# Patient Record
Sex: Male | Born: 1990 | Race: Black or African American | Hispanic: No | Marital: Single | Smoking: Current every day smoker
Health system: Southern US, Community
[De-identification: ages and names within clinical notes are randomized; demographics above are authoritative.]

## PROBLEM LIST (undated history)

## (undated) DIAGNOSIS — N200 Calculus of kidney: Secondary | ICD-10-CM

## (undated) DIAGNOSIS — J189 Pneumonia, unspecified organism: Secondary | ICD-10-CM

---

## 2015-09-18 ENCOUNTER — Encounter: Payer: Self-pay | Admitting: Emergency Medicine

## 2015-09-18 ENCOUNTER — Emergency Department
Admission: EM | Admit: 2015-09-18 | Discharge: 2015-09-18 | Disposition: A | Discharge status: Home / Self Care | Payer: Self-pay | Attending: Emergency Medicine | Admitting: Emergency Medicine

## 2015-09-18 DIAGNOSIS — A6000 Herpesviral infection of urogenital system, unspecified: Secondary | ICD-10-CM | POA: Insufficient documentation

## 2015-09-18 DIAGNOSIS — F17200 Nicotine dependence, unspecified, uncomplicated: Secondary | ICD-10-CM | POA: Insufficient documentation

## 2015-09-18 DIAGNOSIS — F1290 Cannabis use, unspecified, uncomplicated: Secondary | ICD-10-CM | POA: Insufficient documentation

## 2015-09-18 LAB — URINALYSIS COMPLETE WITH MICROSCOPIC (ARMC ONLY)
BACTERIA UA: NONE SEEN
BILIRUBIN URINE: NEGATIVE
GLUCOSE, UA: NEGATIVE mg/dL
HGB URINE DIPSTICK: NEGATIVE
KETONES UR: NEGATIVE mg/dL
LEUKOCYTES UA: NEGATIVE
NITRITE: NEGATIVE
Protein, ur: NEGATIVE mg/dL
Specific Gravity, Urine: 1.011 (ref 1.005–1.030)
Squamous Epithelial / LPF: NONE SEEN
pH: 7 (ref 5.0–8.0)

## 2015-09-18 MED ORDER — ACYCLOVIR 800 MG PO TABS: 800.0000 mg | ORAL_TABLET | Freq: Every day | ORAL | Status: DC

## 2015-09-18 NOTE — Discharge Instructions (Signed)

## 2015-09-18 NOTE — ED Notes (Signed)
Pt presents to ED c/o of "something on the tip of his penis" that was noticed two days ago. Denies fever, drainage, discharge. Pain 3/10

## 2015-09-18 NOTE — ED Provider Notes (Signed)
[facility] Emergency Department Provider Note    ____________________________________________  Time seen: Approximately 11:13 AM  I have reviewed the triage vital signs and the nursing notes.   HISTORY  Chief Complaint Exposure to STD    HPI Roberto Pratt is a 25 y.o. male presenting with "something on the tip of his penis" for 2 days. States it is on the anterior head of the penis near the base. A new spot is also forming on the posterior side of the penis and was noticed yesterday. He only feels pain from it when he urinates and describes the pain as a 3/10 "itchy" pain. He is currently sexually active with 1 partner for the last 2 weeks, however, a partner from 5-6 months ago had informed him that she had "'something" and that he should go get checked. He "kind of" uses condoms. Patient denies palliative measures for this complaint.  Denies fever, discharge, and hematuria.    History reviewed. No pertinent past medical history.  There are no active problems to display for this patient.   History reviewed. No pertinent past surgical history.  No current outpatient prescriptions on file.  Allergies Review of patient's allergies indicates no known allergies.  History reviewed. No pertinent family history.  Social History Social History  Substance Use Topics  . Smoking status: Current Every Day Smoker  . Smokeless tobacco: None  . Alcohol Use: Yes    Review of Systems Constitutional: No fever/chills Eyes: No visual changes. ENT: No sore throat. Cardiovascular: Denies chest pain. Respiratory: Denies shortness of breath. Gastrointestinal: No abdominal pain.  No nausea, no vomiting.  No diarrhea.  No constipation. Genitourinary: Negative for dysuria. Lesion on the shaft of penis Musculoskeletal: Negative for back pain. Skin: Negative for rash. Vesicle lesions penial shaft Neurological: Negative for headaches, focal weakness or  numbness.    ____________________________________________   PHYSICAL EXAM:  VITAL SIGNS: ED Triage Vitals  Enc Vitals Group     BP 09/18/15 1107 134/68 mmHg     Pulse Rate 09/18/15 1107 58     Resp 09/18/15 1107 18     Temp 09/18/15 1107 98.4 F (36.9 C)     Temp Source 09/18/15 1107 Oral     SpO2 09/18/15 1107 99 %     Weight 09/18/15 1107 140 lb (63.504 kg)     Height 09/18/15 1107  (1.778 m)     Head Cir --      Peak Flow --      Pain Score 09/18/15 1107 3     Pain Loc --      Pain Edu? --      Excl. in GC? --     Constitutional: Alert and oriented. Well appearing and in no acute distress. Eyes: Conjunctivae are normal. PERRL. EOMI. Head: Atraumatic. Nose: No congestion/rhinnorhea. Mouth/Throat: Mucous membranes are moist.  Oropharynx non-erythematous. Neck: No stridor.  No cervical spine tenderness to palpation. Hematological/Lymphatic/Immunilogical: No cervical lymphadenopathy. Cardiovascular: Normal rate, regular rhythm. Grossly normal heart sounds.  Good peripheral circulation. Respiratory: Normal respiratory effort.  No retractions. Lungs CTAB. Gastrointestinal: Soft and nontender. No distention. No abdominal bruits. No CVA tenderness. Musculoskeletal: No lower extremity tenderness nor edema.  No joint effusions. Neurologic:  Normal speech and language. No gross focal neurologic deficits are appreciated. No gait instability. Skin:  Skin is warm, dry and intact. 2 vesicular lesions noticed on the meatus of the penis. Psychiatric: Mood and affect are normal. Speech and behavior are normal.  ____________________________________________   LABS (all labs ordered are listed, but only abnormal results are displayed)  Labs Reviewed  URINALYSIS COMPLETEWITH MICROSCOPIC ([facility] ONLY) - Abnormal; Notable for the following:    Color, Urine YELLOW (*)    APPearance CLEAR (*)    All other components within normal limits    ____________________________________________  EKG   ____________________________________________  RADIOLOGY   ____________________________________________   PROCEDURES  Procedure(s) performed: None  Procedures  Critical Care performed: No  ____________________________________________   INITIAL IMPRESSION / ASSESSMENT AND PLAN / ED COURSE  Pertinent labs & imaging results that were available during my care of the patient were reviewed by me and considered in my medical decision making (see chart for details).  Herpes simplex 2. Patient given discharge care instructions. Patient given prescriptions for acyclovir. Patient advised to have his sexual partner follow-up with the health department. ____________________________________________   FINAL CLINICAL IMPRESSION(S) / ED DIAGNOSES  Final diagnoses:  None      NEW MEDICATIONS STARTED DURING THIS VISIT:  New Prescriptions   No medications on file     Note:  This document was prepared using Dragon voice recognition software and may include unintentional dictation errors.    Joni Reiningonald K Smith, PA-C 09/18/15 1202  Joni Reiningonald K Smith, PA-C 09/18/15 1202  Jene Everyobert Kinner, MD 09/18/15 [phone]

## 2018-12-20 ENCOUNTER — Emergency Department
Admission: EM | Admit: 2018-12-20 | Discharge: 2018-12-20 | Disposition: A | Discharge status: Home / Self Care | Payer: Self-pay | Attending: Emergency Medicine | Admitting: Emergency Medicine

## 2018-12-20 ENCOUNTER — Emergency Department: Payer: Self-pay

## 2018-12-20 ENCOUNTER — Other Ambulatory Visit: Payer: Self-pay

## 2018-12-20 ENCOUNTER — Encounter: Payer: Self-pay | Admitting: Emergency Medicine

## 2018-12-20 DIAGNOSIS — F17200 Nicotine dependence, unspecified, uncomplicated: Secondary | ICD-10-CM | POA: Insufficient documentation

## 2018-12-20 DIAGNOSIS — R0789 Other chest pain: Secondary | ICD-10-CM | POA: Insufficient documentation

## 2018-12-20 DIAGNOSIS — Z79899 Other long term (current) drug therapy: Secondary | ICD-10-CM | POA: Insufficient documentation

## 2018-12-20 HISTORY — DX: Pneumonia, unspecified organism: J18.9

## 2018-12-20 HISTORY — DX: Calculus of kidney: N20.0

## 2018-12-20 LAB — COMPREHENSIVE METABOLIC PANEL
ALT: 15 U/L (ref 0–44)
AST: 21 U/L (ref 15–41)
Albumin: 4.1 g/dL (ref 3.5–5.0)
Alkaline Phosphatase: 85 U/L (ref 38–126)
Anion gap: 7 (ref 5–15)
BUN: 14 mg/dL (ref 6–20)
CO2: 30 mmol/L (ref 22–32)
Calcium: 9.4 mg/dL (ref 8.9–10.3)
Chloride: 102 mmol/L (ref 98–111)
Creatinine, Ser: 0.84 mg/dL (ref 0.61–1.24)
GFR calc Af Amer: >60 mL/min (ref >60)
GFR calc non Af Amer: >60 mL/min (ref >60)
Glucose, Bld: 72 mg/dL (ref 70–99)
Potassium: 3.9 mmol/L (ref 3.5–5.1)
Sodium: 139 mmol/L (ref 135–145)
Total Bilirubin: 0.7 mg/dL (ref 0.3–1.2)
Total Protein: 7.8 g/dL (ref 6.5–8.1)

## 2018-12-20 LAB — CBC WITH DIFFERENTIAL/PLATELET
Abs Immature Granulocytes: 0.04 10*3/uL (ref 0.00–0.07)
Basophils Absolute: 0 10*3/uL (ref 0.0–0.1)
Basophils Relative: 0 %
Eosinophils Absolute: 0.1 10*3/uL (ref 0.0–0.5)
Eosinophils Relative: 1 %
HCT: 48.6 % (ref 39.0–52.0)
Hemoglobin: 15.7 g/dL (ref 13.0–17.0)
Immature Granulocytes: 0 %
Lymphocytes Relative: 18 %
Lymphs Abs: 1.7 10*3/uL (ref 0.7–4.0)
MCH: 29.8 pg (ref 26.0–34.0)
MCHC: 32.3 g/dL (ref 30.0–36.0)
MCV: 92.4 fL (ref 80.0–100.0)
Monocytes Absolute: 1.2 10*3/uL — ABNORMAL HIGH (ref 0.1–1.0)
Monocytes Relative: 13 %
Neutro Abs: 6.3 10*3/uL (ref 1.7–7.7)
Neutrophils Relative %: 68 %
Platelets: 227 10*3/uL (ref 150–400)
RBC: 5.26 MIL/uL (ref 4.22–5.81)
RDW: 15.1 % (ref 11.5–15.5)
WBC: 9.4 10*3/uL (ref 4.0–10.5)
nRBC: 0 % (ref 0.0–0.2)

## 2018-12-20 LAB — TROPONIN I (HIGH SENSITIVITY): Troponin I (High Sensitivity): 5 ng/L (ref <18)

## 2018-12-20 MED ORDER — KETOROLAC TROMETHAMINE 30 MG/ML IJ SOLN
15.0000 mg | Freq: Once | INTRAMUSCULAR | Status: AC
  Administered 2018-12-20: 15 mg via INTRAVENOUS
  Filled 2018-12-20: qty 1

## 2018-12-20 MED ORDER — IBUPROFEN 600 MG PO TABS: 600.0000 mg | ORAL_TABLET | Freq: Three times a day (TID) | ORAL | 0 refills | Status: DC | PRN

## 2018-12-20 NOTE — ED Triage Notes (Signed)
Pt c/o left side chest pain that started yesterday radiating into left shoulder/back and now has an achy feeling in left arm.  Ache has been constant and at times pain gets worse. Was radiating into neck as well.  Describes as squeezing feeling in chest.  Hx of PNA in past. Smokes cigarettes and weed. No cardiac hx. Does not know family hx.

## 2018-12-20 NOTE — Discharge Instructions (Signed)
As we discussed, your pain could be due to pulling of the muscles in your chest due to coughing.  Try to avoid smoking.  I recommend taking ibuprofen every 8 hours for the next 3 days, then as needed for pain.

## 2018-12-20 NOTE — ED Provider Notes (Signed)
[facility] Emergency Department Provider Note  ____________________________________________   First MD Initiated Contact with Patient 12/20/18 1213     (approximate)  I have reviewed the triage vital signs and the nursing notes.   HISTORY  Chief Complaint Chest Pain    HPI Roberto Pratt is a 28 y.o. male  Here with chest pain. Pt reports that over the past day, he's had gradual onset of aching, but also intermittently sharp anterior and left sided CP. Pain is constant but worse with movement and palpation. No alleviating factors. No significant SOB or diaphoresis. Reports he has a h/o similar sx in past, worse when he coughs more. No LE edema, no calf TTP. No other complaints.        Past Medical History:  Diagnosis Date  . Kidney stone   . Pneumonia     There are no active problems to display for this patient.   History reviewed. No pertinent surgical history.  Prior to Admission medications   Medication Sig Start Date End Date Taking? Authorizing Provider  acyclovir (ZOVIRAX) 800 MG tablet Take 1 tablet (800 mg total) by mouth 5 (five) times daily. 09/18/15   Joni Reining, PA-C  ibuprofen (ADVIL) 600 MG tablet Take 1 tablet (600 mg total) by mouth every 8 (eight) hours as needed for moderate pain. 12/20/18   Shaune Pollack, MD    Allergies Patient has no known allergies.  History reviewed. No pertinent family history.  Social History Social History   Tobacco Use  . Smoking status: Current Every Day Smoker  . Smokeless tobacco: Never Used  Substance Use Topics  . Alcohol use: Yes  . Drug use: Yes    Types: Marijuana    Review of Systems  Review of Systems  Constitutional: Negative for chills, fatigue and fever.  HENT: Negative for sore throat.   Respiratory: Positive for chest tightness. Negative for shortness of breath.   Cardiovascular: Positive for chest pain.  Gastrointestinal: Negative for abdominal pain.   Genitourinary: Negative for flank pain.  Musculoskeletal: Negative for neck pain.  Skin: Negative for rash and wound.  Allergic/Immunologic: Negative for immunocompromised state.  Neurological: Negative for weakness and numbness.  Hematological: Does not bruise/bleed easily.     ____________________________________________  PHYSICAL EXAM:      VITAL SIGNS: ED Triage Vitals  Enc Vitals Group     BP 12/20/18 1142 119/67     Pulse Rate 12/20/18 1142 63     Resp 12/20/18 1142 16     Temp 12/20/18 1142 98.3 F (36.8 C)     Temp Source 12/20/18 1142 Oral     SpO2 12/20/18 1142 99 %     Weight 12/20/18 1143 135 lb (61.2 kg)     Height 12/20/18 1143 5\' 10"  (1.778 m)     Head Circumference --      Peak Flow --      Pain Score 12/20/18 1147 4     Pain Loc --      Pain Edu? --      Excl. in GC? --      Physical Exam Vitals signs and nursing note reviewed.  Constitutional:      General: He is not in acute distress.    Appearance: He is well-developed.  HENT:     Head: Normocephalic and atraumatic.  Eyes:     Conjunctiva/sclera: Conjunctivae normal.  Neck:     Musculoskeletal: Neck supple.  Cardiovascular:     Rate  and Rhythm: Normal rate and regular rhythm.     Heart sounds: Normal heart sounds. No murmur. No friction rub.  Pulmonary:     Effort: Pulmonary effort is normal. No respiratory distress.     Breath sounds: Normal breath sounds. No wheezing or rales.  Chest:     Comments: Moderate TTP left anterior chest intercostal spaces Abdominal:     General: There is no distension.     Palpations: Abdomen is soft.     Tenderness: There is no abdominal tenderness.  Skin:    General: Skin is warm.     Capillary Refill: Capillary refill takes less than 2 seconds.  Neurological:     Mental Status: He is alert and oriented to person, place, and time.     Motor: No abnormal muscle tone.       ____________________________________________   LABS (all labs ordered are  listed, but only abnormal results are displayed)  Labs Reviewed  CBC WITH DIFFERENTIAL/PLATELET - Abnormal; Notable for the following components:      Result Value   Monocytes Absolute 1.2 (*)    All other components within normal limits  COMPREHENSIVE METABOLIC PANEL  TROPONIN I (HIGH SENSITIVITY)    ____________________________________________  EKG: Sinus bradycardia, VR 54. Normal intervals. J point elevation, no ST elevations or depressions. ________________________________________  RADIOLOGY All imaging, including plain films, CT scans, and ultrasounds, independently reviewed by me, and interpretations confirmed via formal radiology reads.  ED MD interpretation:   CXR: Clear, no acute abnormalities  Official radiology report(s): Dg Chest 2 View  Result Date: 12/20/2018 CLINICAL DATA:  Left-sided chest pain. EXAM: CHEST - 2 VIEW COMPARISON:  None. FINDINGS: Cardiomediastinal silhouette is normal. Mediastinal contours appear intact. There is no evidence of focal airspace consolidation, pleural effusion or pneumothorax. Osseous structures are without acute abnormality. Soft tissues are grossly normal. IMPRESSION: No active cardiopulmonary disease. Electronically Signed   By: Ted Mcalpineobrinka  Dimitrova M.D.   On: 12/20/2018 13:58    ____________________________________________  PROCEDURES   Procedure(s) performed (including Critical Care):  Procedures  ____________________________________________  INITIAL IMPRESSION / MDM / ASSESSMENT AND PLAN / ED COURSE  As part of my medical decision making, I reviewed the following data within the electronic MEDICAL RECORD NUMBER Notes from prior ED visits and [place] Controlled Substance Database      *Roberto Pratt was evaluated in Emergency Department on 12/20/2018 for the symptoms described in the history of present illness. He was evaluated in the context of the global COVID-19 pandemic, which necessitated consideration that the patient  might be at risk for infection with the SARS-CoV-2 virus that causes COVID-19. Institutional protocols and algorithms that pertain to the evaluation of patients at risk for COVID-19 are in a state of rapid change based on information released by regulatory bodies including the CDC and federal and state organizations. These policies and algorithms were followed during the patient's care in the ED.  Some ED evaluations and interventions may be delayed as a result of limited staffing during the pandemic.*      Medical Decision Making:  [age] yo M with PMHx as above here with atypical constant CP. EKG nonischemic. Trop neg despite constant sx and HEART score <3, doubt ACS. He is PERC neg with no signs to suggest PE. Pain is not c/w dissection. CXR clear without PNA or PTX. Suspect MSK chest wall pain 2/2 coughing from smoking and MJ use. Tobacco cessation counselled. Tx with NSAIDs, outpt follow-up.  ____________________________________________  FINAL CLINICAL IMPRESSION(S) /  ED DIAGNOSES  Final diagnoses:  Atypical chest pain     MEDICATIONS GIVEN DURING THIS VISIT:  Medications  ketorolac (TORADOL) 30 MG/ML injection 15 mg (15 mg Intravenous Given 12/20/18 1256)     ED Discharge Orders         Ordered    ibuprofen (ADVIL) 600 MG tablet  Every 8 hours PRN     12/20/18 1502           Note:  This document was prepared using Dragon voice recognition software and may include unintentional dictation errors.   Duffy Bruce, MD 12/20/18 2028

## 2019-11-08 ENCOUNTER — Emergency Department
Admission: EM | Admit: 2019-11-08 | Discharge: 2019-11-08 | Disposition: A | Discharge status: Home / Self Care | Payer: Self-pay | Attending: Emergency Medicine | Admitting: Emergency Medicine

## 2019-11-08 ENCOUNTER — Other Ambulatory Visit: Payer: Self-pay

## 2019-11-08 ENCOUNTER — Encounter: Payer: Self-pay | Admitting: Emergency Medicine

## 2019-11-08 DIAGNOSIS — F17200 Nicotine dependence, unspecified, uncomplicated: Secondary | ICD-10-CM | POA: Insufficient documentation

## 2019-11-08 DIAGNOSIS — K0889 Other specified disorders of teeth and supporting structures: Secondary | ICD-10-CM | POA: Insufficient documentation

## 2019-11-08 DIAGNOSIS — R519 Headache, unspecified: Secondary | ICD-10-CM | POA: Insufficient documentation

## 2019-11-08 MED ORDER — LIDOCAINE VISCOUS HCL 2 % MT SOLN
15.0000 mL | Freq: Once | OROMUCOSAL | Status: AC
  Administered 2019-11-08: 15 mL via OROMUCOSAL
  Filled 2019-11-08: qty 15

## 2019-11-08 MED ORDER — NAPROXEN 500 MG PO TABS: 500.0000 mg | ORAL_TABLET | Freq: Two times a day (BID) | ORAL | 2 refills | Status: AC

## 2019-11-08 MED ORDER — TRAMADOL HCL 50 MG PO TABS: 50.0000 mg | ORAL_TABLET | Freq: Four times a day (QID) | ORAL | 0 refills | Status: AC | PRN

## 2019-11-08 MED ORDER — LIDOCAINE VISCOUS HCL 2 % MT SOLN: 5.0000 mL | Freq: Four times a day (QID) | OROMUCOSAL | 0 refills | Status: AC | PRN

## 2019-11-08 NOTE — ED Provider Notes (Signed)
[facility] Emergency Department Provider Note   ____________________________________________   First MD Initiated Contact with Patient 11/08/19 1340     (approximate)  I have reviewed the triage vital signs and the nursing notes.   HISTORY  Chief Complaint Dental Pain    HPI Roberto Pratt is a 29 y.o. male patient presents with dental pain secondary to impacted wisdom tooth.  Patient states waiting for his dental insurance to start from his new job.  Patient the pain is causing headache and ear pain.  Patient stated no relief with over-the-counter Tylenol.  Patient rates pain as a 10/10.  Patient described pain is "achy".         Past Medical History:  Diagnosis Date  . Kidney stone   . Pneumonia     There are no problems to display for this patient.   History reviewed. No pertinent surgical history.  Prior to Admission medications   Medication Sig Start Date End Date Taking? Authorizing Provider  lidocaine (XYLOCAINE) 2 % solution Use as directed 5 mLs in the mouth or throat every 6 (six) hours as needed for mouth pain. 11/08/19   Joni Reining, PA-C  naproxen (NAPROSYN) 500 MG tablet Take 1 tablet (500 mg total) by mouth 2 (two) times daily with a meal. 11/08/19 11/07/20  Joni Reining, PA-C  traMADol (ULTRAM) 50 MG tablet Take 1 tablet (50 mg total) by mouth every 6 (six) hours as needed. 11/08/19 11/07/20  Joni Reining, PA-C    Allergies Patient has no known allergies.  History reviewed. No pertinent family history.  Social History Social History   Tobacco Use  . Smoking status: Current Every Day Smoker  . Smokeless tobacco: Never Used  Substance Use Topics  . Alcohol use: Yes  . Drug use: Yes    Types: Marijuana    Review of Systems Constitutional: No fever/chills Eyes: No visual changes. ENT: No sore throat.  Dental pain. Cardiovascular: Denies chest pain. Respiratory: Denies shortness of  breath. Gastrointestinal: No abdominal pain.  No nausea, no vomiting.  No diarrhea.  No constipation. Genitourinary: Negative for dysuria. Musculoskeletal: Negative for back pain. Skin: Negative for rash. Neurological: Negative for headaches, focal weakness or numbness.   ____________________________________________   PHYSICAL EXAM:  VITAL SIGNS: ED Triage Vitals [11/08/19 1308]  Enc Vitals Group     BP (!) 164/92     Pulse Rate 61     Resp 16     Temp 98.5 F (36.9 C)     Temp Source Oral     SpO2 98 %     Weight 130 lb (59 kg)     Height 5\' 10"  (1.778 m)     Head Circumference      Peak Flow      Pain Score 10     Pain Loc      Pain Edu?      Excl. in GC?     Constitutional: Alert and oriented. Well appearing and in no acute distress. Mouth/Throat: Mucous membranes are moist.  Oropharynx non-erythematous.  Gingiva edema Neck: No stridor.   Hematological/Lymphatic/Immunilogical: No cervical lymphadenopathy. Cardiovascular: Normal rate, regular rhythm. Grossly normal heart sounds.  Good peripheral circulation.  Elevated blood pressure. Respiratory: Normal respiratory effort.  No retractions. Lungs CTAB. Gastrointestinal: Soft and nontender. No distention. No abdominal bruits. No CVA tenderness. Musculoskeletal: No lower extremity tenderness nor edema.  No joint effusions. Neurologic:  Normal speech and language. No gross focal neurologic  deficits are appreciated. No gait instability. Skin:  Skin is warm, dry and intact. No rash noted. Psychiatric: Mood and affect are normal. Speech and behavior are normal.  ____________________________________________   LABS (all labs ordered are listed, but only abnormal results are displayed)  Labs Reviewed - No data to display ____________________________________________  EKG   ____________________________________________  RADIOLOGY  ED MD interpretation:    Official radiology report(s): No results  found.  ____________________________________________   PROCEDURES  Procedure(s) performed (including Critical Care):  Procedures   ____________________________________________   INITIAL IMPRESSION / ASSESSMENT AND PLAN / ED COURSE  As part of my medical decision making, I reviewed the following data within the electronic MEDICAL RECORD NUMBER     Patient presents with dental pain secondary to impacted wisdom tooth.  Patient given discharge care instruction list of dental clinics for follow-up care.  Take medication as directed.    Roberto Pratt was evaluated in Emergency Department on 11/08/2019 for the symptoms described in the history of present illness. He was evaluated in the context of the global COVID-19 pandemic, which necessitated consideration that the patient might be at risk for infection with the SARS-CoV-2 virus that causes COVID-19. Institutional protocols and algorithms that pertain to the evaluation of patients at risk for COVID-19 are in a state of rapid change based on information released by regulatory bodies including the CDC and federal and state organizations. These policies and algorithms were followed during the patient's care in the ED.       ____________________________________________   FINAL CLINICAL IMPRESSION(S) / ED DIAGNOSES  Final diagnoses:  Pain, dental     ED Discharge Orders         Ordered    traMADol (ULTRAM) 50 MG tablet  Every 6 hours PRN        11/08/19 1402    naproxen (NAPROSYN) 500 MG tablet  2 times daily with meals        11/08/19 1402    lidocaine (XYLOCAINE) 2 % solution  Every 6 hours PRN        11/08/19 1402           Note:  This document was prepared using Dragon voice recognition software and may include unintentional dictation errors.    Joni Reining, PA-C 11/08/19 1407    Delton [place], MD 11/08/19 [phone]

## 2019-11-08 NOTE — ED Triage Notes (Signed)
Here for right lower dental pain for over week.  Knows needs tooth pulled but waiting for dental insurance to start.  Causing headache to right head/ear/temple.  Has tried tylenol OTC without relief.

## 2019-11-08 NOTE — Discharge Instructions (Addendum)
Follow discharge care instruction take medication as directed.  Try to contact list of dental clinics in your discharge care instruction to schedule an appointment. OPTIONS FOR DENTAL FOLLOW UP CARE  [place] [facility] - Local Safety Net Dental Clinics [email or web address]   [facility] [phone])  [facility] [phone])  [facility] [phone])  [facility] [phone])  [facility] [phone]) This clinic caters to the indigent population and is on a lottery system. Location: [employer] [facility], [employer], [address] [place] Clinic Hours: Wednesdays from 6pm - 9pm, patients seen by a lottery system. For dates, call or go to [email or web address] Services: Cleanings, fillings and simple extractions. Payment Options: DENTAL WORK IS FREE OF CHARGE. Bring proof of income or support. Best way to get seen: Arrive at 5:15 pm - this is a lottery, NOT first come/first serve, so arriving earlier will not increase your chances of being seen.     [facility] [phone] Select option 1 for emergencies   Location: [facility], [place], [address], [place] Clinic Hours: No walk-ins accepted - call the day before to schedule an appointment. Check in times are 9:30 am and 1:30 pm. Services: Simple extractions, temporary fillings, pulpectomy/pulp debridement, uncomplicated abscess drainage. Payment Options: PAYMENT IS DUE AT THE TIME OF SERVICE.  Fee is usually $100-200, additional surgical procedures (e.g. abscess drainage) may be extra. Cash, checks, Visa/MasterCard accepted.  Can file Medicaid if patient is covered for dental - patient should call case worker to check. No discount for [facility] patients. Best way to get seen: MUST  call the day before and get onto the schedule. Can usually be seen the next 1-2 days. No walk-ins accepted.     [facility] [phone]   Location: [facility], [address] [place] Clinic Hours: M, W, Th, F 8am or 1:30pm, Tues 9a or 1:30 - first come/first served. Services: Simple extractions, temporary fillings, uncomplicated abscess drainage.  You do not need to be an [facility] resident. Payment Options: PAYMENT IS DUE AT THE TIME OF SERVICE. Dental insurance, otherwise sliding scale - bring proof of income or support. Depending on income and treatment needed, cost is usually $50-200. Best way to get seen: Arrive early as it is first come/first served.     [facility] [phone]   Location: [address] Clinic Hours: Mon-Thu 8a-5p Services: Most basic dental services including extractions and fillings. Payment Options: PAYMENT IS DUE AT THE TIME OF SERVICE. Sliding scale, up to 50% off - bring proof if income or support. Medicaid with dental option accepted. Best way to get seen: Call to schedule an appointment, can usually be seen within 2 weeks OR they will try to see walk-ins - show up at 8a or 2p (you may have to wait).     [facility] [phone] [place] RESIDENTS ONLY   Location: [facility], [address], [place], [place] [address] Clinic Hours: By appointment only. Monday - Thursday 8am-5pm, Friday 8am-12pm Services: Cleanings, fillings, extractions. Payment Options: PAYMENT IS DUE AT THE TIME OF SERVICE. Cash, Visa or MasterCard. Sliding scale - $30 minimum per service. Best way to get seen: Come in to office, complete packet and make an appointment - need proof of income or support monies for each household member and proof of [facility]  residence. Usually takes about a month to get in.     [facility] [phone]   Location: [address], [facility] Clinic Hours: Walk-in Urgent Care Dental Services are offered Monday-Friday mornings only. The numbers of emergencies accepted daily is limited to the number of providers available. Maximum 15 - Mondays, Wednesdays & Thursdays Maximum 10 - Tuesdays & Fridays Services: You do not need to be a [facility] resident to be seen for a dental emergency. Emergencies are defined as pain, swelling, abnormal bleeding, or dental trauma. Walkins will receive x-rays if needed. NOTE: Dental cleaning is not an emergency. Payment Options: PAYMENT IS DUE AT THE TIME OF SERVICE. Minimum co-pay is $40.00 for uninsured patients. Minimum co-pay is $3.00 for Medicaid with dental coverage. Dental Insurance is accepted and must be presented at time of visit. Medicare does not cover dental. Forms of payment: Cash, credit card, checks. Best way to get seen: If not previously registered with the clinic, walk-in dental registration begins at 7:15 am and is on a first come/first serve basis. If previously registered with the clinic, call to make an appointment.     [facility] [phone] [place] RESIDENTS ONLY   Location: [address] [place], [place] Clinic Hours: Mon-Thu 10a-2p Services: Extractions only! Payment Options: FREE (donations accepted) - bring proof of income or support Best way to get seen: Call and schedule an appointment OR come at 8am on the 1st Monday of every month (except for holidays) when it is first come/first served.     [facility] [phone]   Location: [address] [place], [place] Clinic Hours: Friday mornings Services, Payment Options, Best way to get seen: Call for info

## 2019-11-08 NOTE — ED Notes (Signed)
See triage note  Presents with right lower dental pain  States he noticed increased pain about 1 week ago

## 2019-12-06 ENCOUNTER — Other Ambulatory Visit: Payer: Self-pay

## 2019-12-06 ENCOUNTER — Ambulatory Visit: Payer: Self-pay | Admitting: Physician Assistant

## 2019-12-06 ENCOUNTER — Encounter: Payer: Self-pay | Admitting: Physician Assistant

## 2019-12-06 DIAGNOSIS — Z202 Contact with and (suspected) exposure to infections with a predominantly sexual mode of transmission: Secondary | ICD-10-CM

## 2019-12-06 DIAGNOSIS — Z113 Encounter for screening for infections with a predominantly sexual mode of transmission: Secondary | ICD-10-CM

## 2019-12-06 MED ORDER — DOXYCYCLINE HYCLATE 100 MG PO TABS: 100.0000 mg | ORAL_TABLET | Freq: Two times a day (BID) | ORAL | 0 refills | Status: AC

## 2019-12-06 MED ORDER — CEFTRIAXONE SODIUM 500 MG IJ SOLR
500.0000 mg | Freq: Once | INTRAMUSCULAR | Status: AC
  Administered 2019-12-06: 500 mg via INTRAMUSCULAR

## 2019-12-06 NOTE — Progress Notes (Signed)
Post:  Patient treated as contact to [facility] with Doxycylcine and ceftriaxone IM per provider order. Patient tolerated well. Declined condoms. Provider orders complete.  Harvie Heck, RN

## 2019-12-06 NOTE — Progress Notes (Signed)
Pt is here for STD screening and reports he is a contact to Gonorrhea and Chlamydia.

## 2019-12-08 ENCOUNTER — Encounter: Payer: Self-pay | Admitting: Physician Assistant

## 2019-12-08 NOTE — Progress Notes (Signed)
   [facility] Department STI clinic/screening visit  Subjective:  Roberto Pratt is a 29 y.o. male being seen today for an STI screening visit. The patient reports they do not have symptoms.    Patient has the following medical conditions:  There are no problems to display for this patient.    Chief Complaint  Patient presents with  . SEXUALLY TRANSMITTED DISEASE    screening    HPI  Patient reports that he does not have any symptoms but is a contact to GC and Chlamydia.  Denies chronic conditions, surgeries and regular medicines.  Reports last HIV testing was about 1 year ago.   See flowsheet for further details and programmatic requirements.    The following portions of the patient's history were reviewed and updated as appropriate: allergies, current medications, past medical history, past social history, past surgical history and problem list.  Objective:  There were no vitals filed for this visit.  Physical Exam Constitutional:      General: He is not in acute distress.    Appearance: Normal appearance.  HENT:     Head: Normocephalic and atraumatic.  Eyes:     Conjunctiva/sclera: Conjunctivae normal.  Pulmonary:     Effort: Pulmonary effort is normal.  Skin:    General: Skin is warm and dry.  Neurological:     Mental Status: He is alert and oriented to person, place, and time.  Psychiatric:        Mood and Affect: Mood normal.        Behavior: Behavior normal.        Thought Content: Thought content normal.        Judgment: Judgment normal.       Assessment and Plan:  Roberto Pratt is a 29 y.o. male presenting to the [facility] Department for STI screening  1. Screening for STD (sexually transmitted disease) Patient into clinic without symptoms. Patient declines screening exam and blood work today. Requests treatment only.  Rec condoms with all sex. Await test results.  Counseled that RN will call if needs to RTC for  treatment once results are back.  2. Venereal disease contact Will treat as a contact to GC and Chlamydia with Ceftriaxone 500 mg IM and Doxycycline 100 mg #14 1 po BID for 7 days. No sex for 7 days and until after partner completes treatment. Call with questions or concerns. - cefTRIAXone (ROCEPHIN) injection 500 mg - doxycycline (VIBRA-TABS) 100 MG tablet; Take 1 tablet (100 mg total) by mouth 2 (two) times daily for 7 days.  Dispense: 14 tablet; Refill: 0     No follow-ups on file.  No future appointments.  Matt Holmes, PA

## 2020-09-20 IMAGING — CR DG CHEST 2V
2 series · 2 of 2 positions shown · non-contrast
Comparison: None.

CLINICAL DATA: Left-sided chest pain.

EXAM:
CHEST - 2 VIEW

[chest pa]
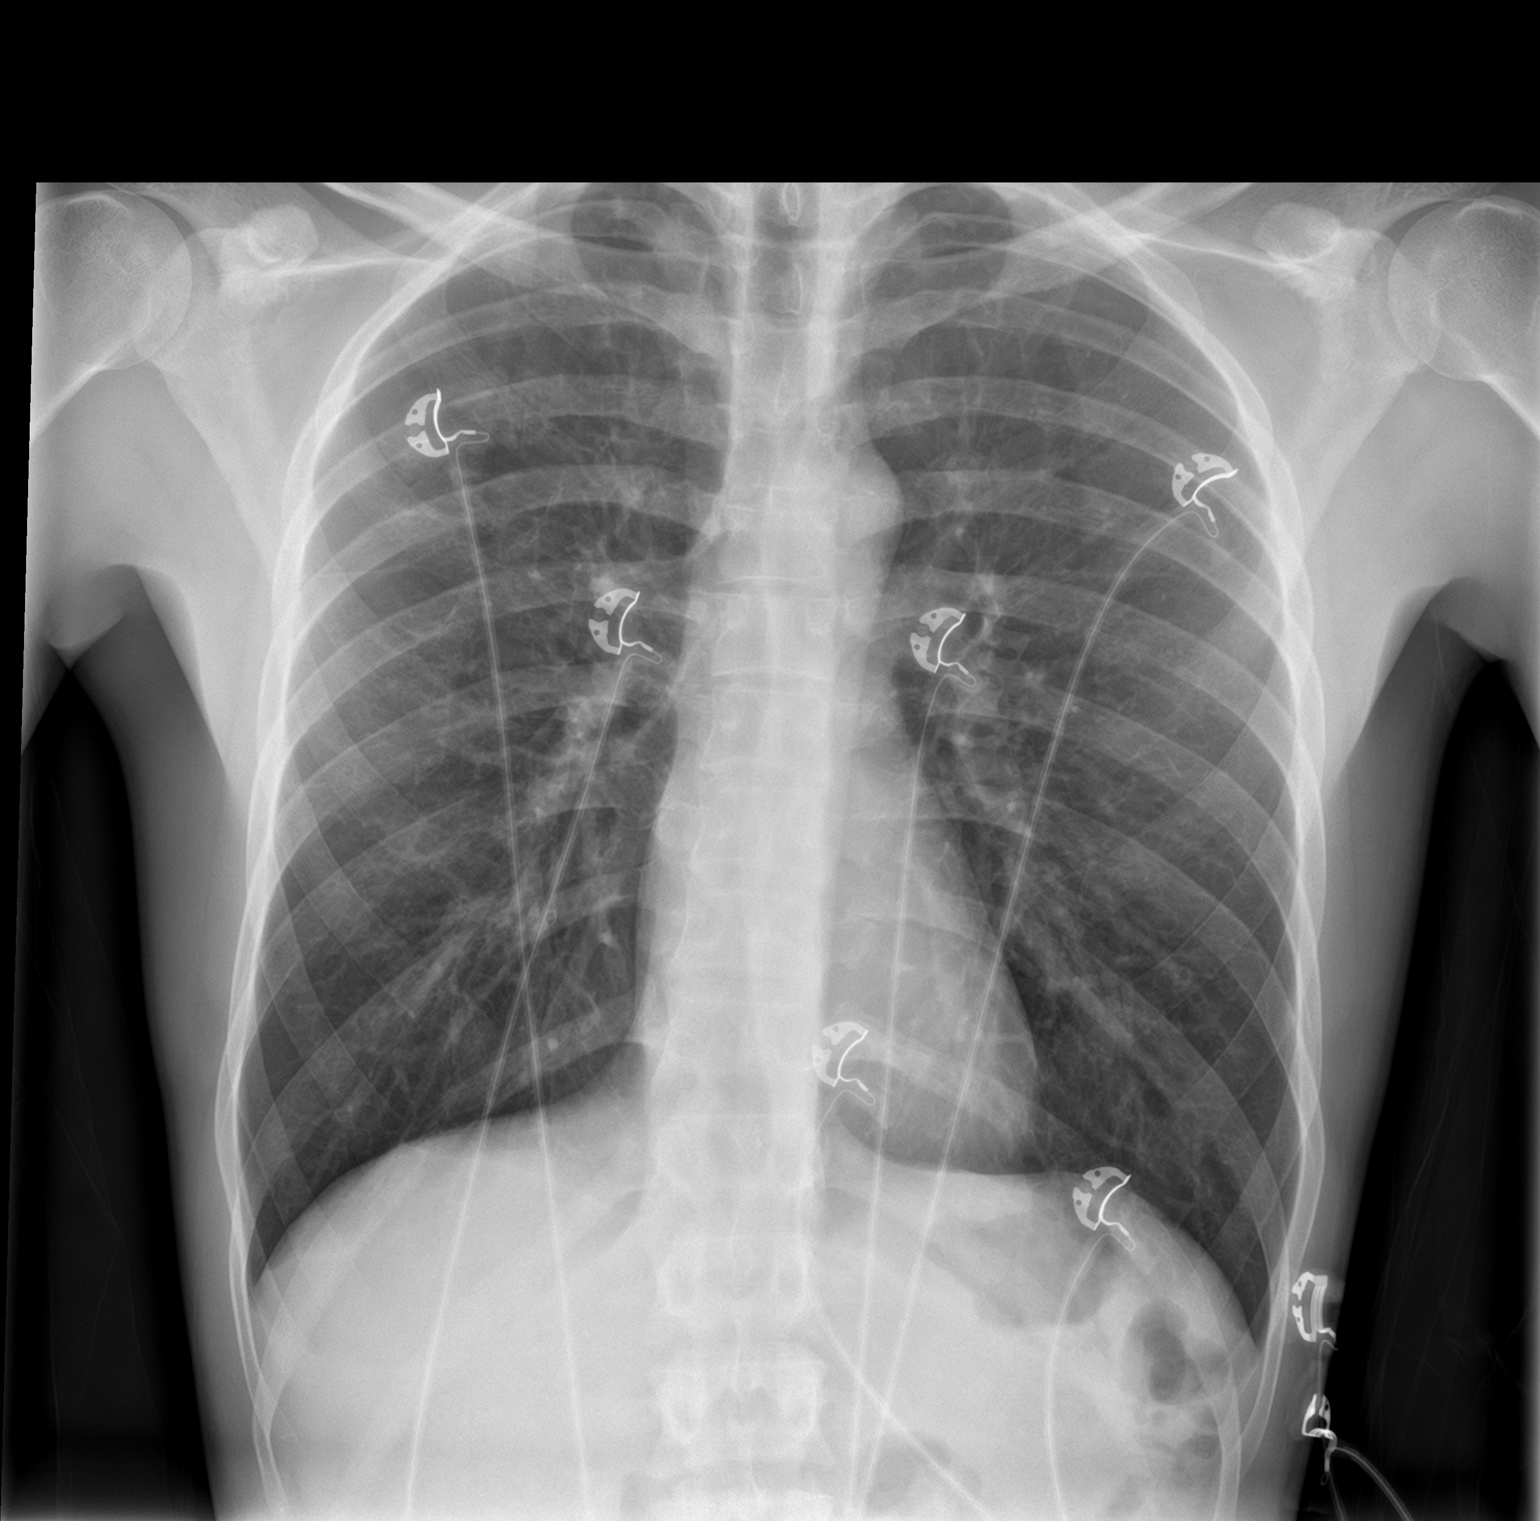

[chest lat]
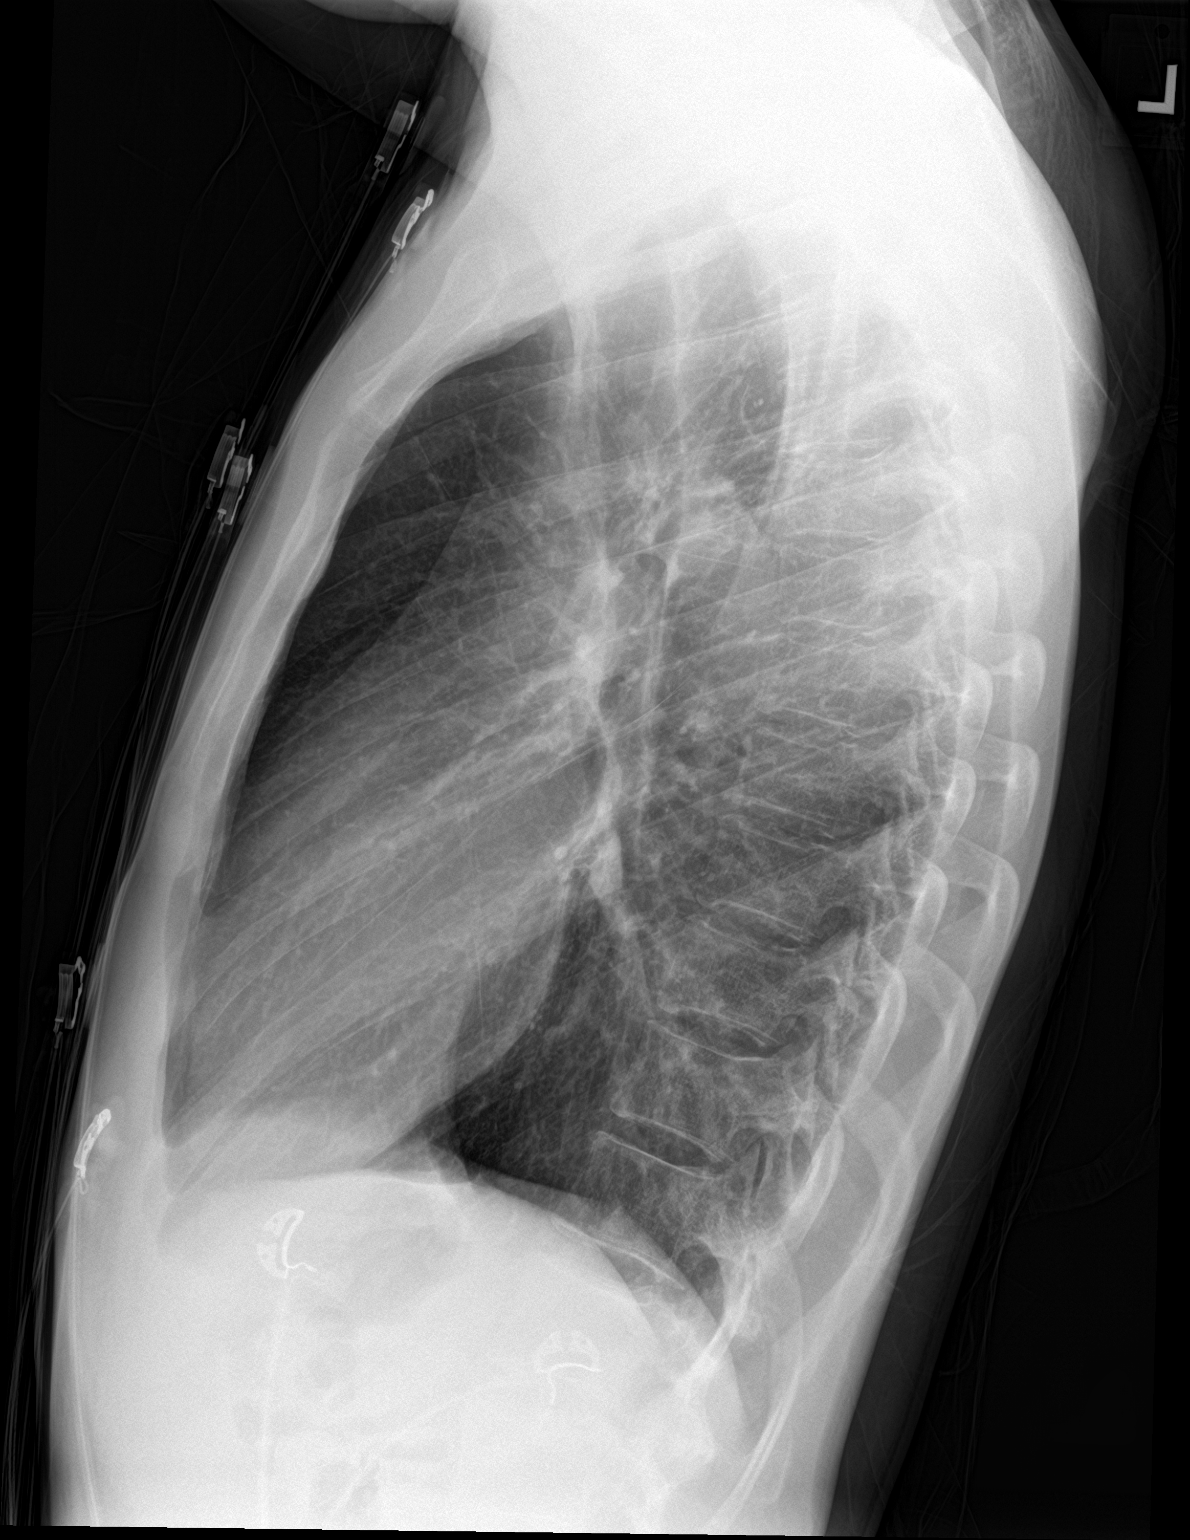

[2 of 2 positions shown; findings below may reference images not displayed]

FINDINGS: Cardiomediastinal silhouette is normal. Mediastinal contours appear
intact.

There is no evidence of focal airspace consolidation, pleural
effusion or pneumothorax.

Osseous structures are without acute abnormality. Soft tissues are
grossly normal.
IMPRESSION: No active cardiopulmonary disease.
